# Patient Record
Sex: Male | Born: 1980 | Race: White | Hispanic: No | Marital: Single | Smoking: Current every day smoker
Health system: Southern US, Community
[De-identification: ages and names within clinical notes are randomized; demographics above are authoritative.]

## PROBLEM LIST (undated history)

## (undated) DIAGNOSIS — N200 Calculus of kidney: Secondary | ICD-10-CM

## (undated) DIAGNOSIS — G4730 Sleep apnea, unspecified: Secondary | ICD-10-CM

## (undated) DIAGNOSIS — F4310 Post-traumatic stress disorder, unspecified: Secondary | ICD-10-CM

## (undated) DIAGNOSIS — E119 Type 2 diabetes mellitus without complications: Secondary | ICD-10-CM

## (undated) DIAGNOSIS — I10 Essential (primary) hypertension: Secondary | ICD-10-CM

## (undated) DIAGNOSIS — F319 Bipolar disorder, unspecified: Secondary | ICD-10-CM

## (undated) HISTORY — PX: FRACTURE SURGERY: SHX138

## (undated) HISTORY — PX: APPENDECTOMY: SHX54

## (undated) HISTORY — PX: OTHER SURGICAL HISTORY: SHX169

---

## 2011-04-29 ENCOUNTER — Emergency Department: Payer: Self-pay | Admitting: Internal Medicine

## 2019-01-02 ENCOUNTER — Other Ambulatory Visit: Payer: Self-pay

## 2019-01-02 ENCOUNTER — Ambulatory Visit
Admission: EM | Admit: 2019-01-02 | Discharge: 2019-01-02 | Disposition: A | Discharge status: Home / Self Care | Payer: Medicare Other | Attending: Family Medicine | Admitting: Family Medicine

## 2019-01-02 ENCOUNTER — Ambulatory Visit (INDEPENDENT_AMBULATORY_CARE_PROVIDER_SITE_OTHER): Payer: Medicare Other

## 2019-01-02 ENCOUNTER — Encounter: Payer: Self-pay | Admitting: Emergency Medicine

## 2019-01-02 DIAGNOSIS — M25531 Pain in right wrist: Secondary | ICD-10-CM | POA: Diagnosis not present

## 2019-01-02 DIAGNOSIS — W06XXXA Fall from bed, initial encounter: Secondary | ICD-10-CM

## 2019-01-02 DIAGNOSIS — S63501A Unspecified sprain of right wrist, initial encounter: Secondary | ICD-10-CM

## 2019-01-02 HISTORY — DX: Essential (primary) hypertension: I10

## 2019-01-02 HISTORY — DX: Type 2 diabetes mellitus without complications: E11.9

## 2019-01-02 HISTORY — DX: Bipolar disorder, unspecified: F31.9

## 2019-01-02 HISTORY — DX: Post-traumatic stress disorder, unspecified: F43.10

## 2019-01-02 NOTE — ED Provider Notes (Signed)
[facility]    CSN: [id number] Arrival date & time: 01/02/19  1423      History   Chief Complaint Chief Complaint  Patient presents with  . Wrist Pain    HPI Daniel Roth is a 38 y.o. male.   HPI  38 year old male presents with right wrist pain that is had for a week.  Dates that he fell out of bed during a nightmare landing on his right dominant outstretched hand.  He states that he is tried over-the-counter Tylenol meloxicam which he takes on a routine basis without relief.  He does tell me that he had been given oxycodone for the pain on the eighth.  He states that he continues to hurt him and certain motions cause him the most pain.  He indicates the pain being over the distal ulnar styloid area.       Past Medical History:  Diagnosis Date  . Bipolar affective disorder (HCC)   . Diabetes mellitus without complication (HCC)   . Hypertension   . PTSD (post-traumatic stress disorder)     There are no active problems to display for this patient.   Past Surgical History:  Procedure Laterality Date  . APPENDECTOMY    . FRACTURE SURGERY Right    ankle  . thumb surgery Left        Home Medications    Prior to Admission medications   Medication Sig Start Date End Date Taking? Authorizing Provider  ARIPiprazole (ABILIFY) 30 MG tablet Take 30 mg by mouth daily.   Yes [provider]  FLUoxetine (PROZAC) 40 MG capsule Take 80 mg by mouth daily.   Yes [provider]  montelukast (SINGULAIR) 10 MG tablet Take 10 mg by mouth at bedtime.   Yes [provider]  sitaGLIPtin (JANUVIA) 100 MG tablet Take 100 mg by mouth daily.   Yes [provider]  valsartan (DIOVAN) 80 MG tablet Take 80 mg by mouth daily.   Yes [provider]    Family History Family History  Problem Relation Age of Onset  . Heart disease Mother   . Diabetes Father   . Melanoma Father     Social History Social History   Tobacco Use  .  Smoking status: Current Every Day Smoker    Packs/day: 0.50    Years: 20.00    Pack years: 10.00    Types: Cigarettes  . Smokeless tobacco: Never Used  Substance Use Topics  . Alcohol use: Never    Frequency: Never  . Drug use: Never     Allergies   Codeine, Cymbalta [duloxetine hcl], and Tramadol   Review of Systems Review of Systems  Constitutional: Positive for activity change. Negative for appetite change, chills, diaphoresis, fatigue and fever.  Musculoskeletal: Positive for arthralgias.  All other systems reviewed and are negative.    Physical Exam Triage Vital Signs ED Triage Vitals  Enc Vitals Group     BP 01/02/19 1438 134/72     Pulse Rate 01/02/19 1438 85     Resp 01/02/19 1438 18     Temp 01/02/19 1438 98.3 F (36.8 C)     Temp Source 01/02/19 1438 Oral     SpO2 01/02/19 1438 96 %     Weight 01/02/19 1437 285 lb (129.3 kg)     Height 01/02/19 1437 5\' 9"  (1.753 m)     Head Circumference --      Peak Flow --      Pain Score  01/02/19 1437 8     Pain Loc --      Pain Edu? --      Excl. in [place]? --    No data found.  Updated Vital Signs BP 134/72 (BP Location: Left Arm)   Pulse 85   Temp 98.3 F (36.8 C) (Oral)   Resp 18   Ht 5\' 9"  (1.753 m)   Wt 285 lb (129.3 kg)   SpO2 96%   BMI 42.09 kg/m   Visual Acuity Right Eye Distance:   Left Eye Distance:   Bilateral Distance:    Right Eye Near:   Left Eye Near:    Bilateral Near:     Physical Exam Vitals signs and nursing note reviewed.  Constitutional:      General: He is not in acute distress.    Appearance: Normal appearance. He is not ill-appearing, toxic-appearing or diaphoretic.  HENT:     Head: Normocephalic and atraumatic.     Nose: Nose normal.     Mouth/Throat:     Mouth: Mucous membranes are moist.  Eyes:     Conjunctiva/sclera: Conjunctivae normal.     Pupils: Pupils are equal, round, and reactive to light.  Neck:     Musculoskeletal: Normal range of motion and neck supple.   Cardiovascular:     Rate and Rhythm: Normal rate and regular rhythm.     Heart sounds: Normal heart sounds.  Pulmonary:     Effort: Pulmonary effort is normal.     Breath sounds: Normal breath sounds.  Musculoskeletal:        General: Tenderness and signs of injury present. No swelling or deformity.     Comments: Examination of the right dominant hand shows no significant swelling ecchymosis or erythema.  There are no breaks in the skin.  Patient has full pronation and supination.  Extension accompanied by pain to 45 degrees and flexion to 40 degrees.  Tenderness is sharply localized over the distal ulnar styloid.  The carpal bones are nontender.  Hand is uninvolved.  He has normal neurovascular function distally.  Skin:    General: Skin is warm and dry.  Neurological:     General: No focal deficit present.     Mental Status: He is alert and oriented to person, place, and time.  Psychiatric:        Mood and Affect: Mood normal.        Behavior: Behavior normal.      UC Treatments / Results  Labs (all labs ordered are listed, but only abnormal results are displayed) Labs Reviewed - No data to display  EKG   Radiology Dg Wrist Complete Right  Result Date: 01/02/2019 CLINICAL DATA:  Pain following fall EXAM: RIGHT WRIST - COMPLETE 3+ VIEW COMPARISON:  None. FINDINGS: Frontal, oblique, lateral, and ulnar deviation scaphoid images were obtained. No fracture or dislocation. Joint spaces appear normal. No erosive change or intra-articular calcification. IMPRESSION: No fracture or dislocation.  No evident arthropathy. Electronically Signed   By: Bretta BangWilliam  Woodruff III M.D.   On: 01/02/2019 15:04    Procedures Procedures (including critical care time)  Medications Ordered in UC Medications - No data to display  Initial Impression / Assessment and Plan / UC Course  I have reviewed the triage vital signs and the nursing notes.  Pertinent labs & imaging results that were available  during my care of the patient were reviewed by me and considered in my medical decision making (see chart for details).  I reviewed the x-rays with the patient.  Is read as no fracture seen although a small lytic line was noticed over the ulnar styloid but in very good position.  Patient was provided with a wrist splint that he will use for activities.  He will continue to take the meloxicam and Tylenol at home.  Is not improving in several weeks he should follow-up with orthopedics.  Final Clinical Impressions(s) / UC Diagnoses   Final diagnoses:  Sprain of right wrist, initial encounter     Discharge Instructions     Use the wrist splint for activities.  If you are not improving in several weeks recommend following up with an orthopedic surgeon    ED Prescriptions    None     I have reviewed the PDMP during this encounter.  Patient had requested narcotic analgesics after reviewing the PDMP refused to give the patient any narcotics.  Evidently he had received oxycodone on the day of the injury.   Lorin Picket, PA-C 01/02/19 1543

## 2019-01-02 NOTE — ED Triage Notes (Signed)
Patient in today c/o right wrist pain x 1 week. Patient states he fell out of bed last weekend and has had pain in his wrist since then. Patient has tried OTC Tylenol, Meloxicam without relief.

## 2019-01-02 NOTE — Discharge Instructions (Signed)
Use the wrist splint for activities.  If you are not improving in several weeks recommend following up with an orthopedic surgeon

## 2019-08-28 ENCOUNTER — Other Ambulatory Visit: Payer: Self-pay

## 2019-08-28 ENCOUNTER — Emergency Department
Admission: EM | Admit: 2019-08-28 | Discharge: 2019-08-28 | Disposition: A | Discharge status: Home / Self Care | Payer: Medicare Other | Attending: Emergency Medicine | Admitting: Emergency Medicine

## 2019-08-28 ENCOUNTER — Emergency Department: Payer: Medicare Other

## 2019-08-28 ENCOUNTER — Encounter: Payer: Self-pay | Admitting: Emergency Medicine

## 2019-08-28 DIAGNOSIS — R112 Nausea with vomiting, unspecified: Secondary | ICD-10-CM | POA: Insufficient documentation

## 2019-08-28 DIAGNOSIS — F17210 Nicotine dependence, cigarettes, uncomplicated: Secondary | ICD-10-CM | POA: Insufficient documentation

## 2019-08-28 DIAGNOSIS — Z7984 Long term (current) use of oral hypoglycemic drugs: Secondary | ICD-10-CM | POA: Insufficient documentation

## 2019-08-28 DIAGNOSIS — Z79899 Other long term (current) drug therapy: Secondary | ICD-10-CM | POA: Insufficient documentation

## 2019-08-28 DIAGNOSIS — K760 Fatty (change of) liver, not elsewhere classified: Secondary | ICD-10-CM

## 2019-08-28 DIAGNOSIS — R1011 Right upper quadrant pain: Secondary | ICD-10-CM | POA: Insufficient documentation

## 2019-08-28 DIAGNOSIS — E119 Type 2 diabetes mellitus without complications: Secondary | ICD-10-CM | POA: Insufficient documentation

## 2019-08-28 DIAGNOSIS — K7460 Unspecified cirrhosis of liver: Secondary | ICD-10-CM

## 2019-08-28 HISTORY — DX: Sleep apnea, unspecified: G47.30

## 2019-08-28 LAB — COMPREHENSIVE METABOLIC PANEL
ALT: 58 U/L — ABNORMAL HIGH (ref 0–44)
AST: 48 U/L — ABNORMAL HIGH (ref 15–41)
Albumin: 4 g/dL (ref 3.5–5.0)
Alkaline Phosphatase: 47 U/L (ref 38–126)
Anion gap: 7 (ref 5–15)
BUN: 7 mg/dL (ref 6–20)
CO2: 29 mmol/L (ref 22–32)
Calcium: 9 mg/dL (ref 8.9–10.3)
Chloride: 102 mmol/L (ref 98–111)
Creatinine, Ser: 0.67 mg/dL (ref 0.61–1.24)
GFR calc Af Amer: >60 mL/min (ref >60)
GFR calc non Af Amer: >60 mL/min (ref >60)
Glucose, Bld: 192 mg/dL — ABNORMAL HIGH (ref 70–99)
Potassium: 3.6 mmol/L (ref 3.5–5.1)
Sodium: 138 mmol/L (ref 135–145)
Total Bilirubin: 0.8 mg/dL (ref 0.3–1.2)
Total Protein: 7.2 g/dL (ref 6.5–8.1)

## 2019-08-28 LAB — CBC
HCT: 41.9 % (ref 39.0–52.0)
Hemoglobin: 13.7 g/dL (ref 13.0–17.0)
MCH: 31.2 pg (ref 26.0–34.0)
MCHC: 32.7 g/dL (ref 30.0–36.0)
MCV: 95.4 fL (ref 80.0–100.0)
Platelets: 135 10*3/uL — ABNORMAL LOW (ref 150–400)
RBC: 4.39 MIL/uL (ref 4.22–5.81)
RDW: 13.1 % (ref 11.5–15.5)
WBC: 6.8 10*3/uL (ref 4.0–10.5)
nRBC: 0 % (ref 0.0–0.2)

## 2019-08-28 LAB — LIPASE, BLOOD: Lipase: 29 U/L (ref 11–51)

## 2019-08-28 MED ORDER — MORPHINE SULFATE (PF) 4 MG/ML IV SOLN
4.0000 mg | Freq: Once | INTRAVENOUS | Status: AC
  Administered 2019-08-28: 4 mg via INTRAVENOUS
  Filled 2019-08-28: qty 1

## 2019-08-28 MED ORDER — ALUM & MAG HYDROXIDE-SIMETH 200-200-20 MG/5ML PO SUSP
30.0000 mL | Freq: Once | ORAL | Status: AC
  Administered 2019-08-28: 30 mL via ORAL
  Filled 2019-08-28: qty 30

## 2019-08-28 MED ORDER — OXYCODONE HCL 5 MG PO TABS: 5.0000 mg | ORAL_TABLET | Freq: Three times a day (TID) | ORAL | 0 refills | Status: DC | PRN

## 2019-08-28 MED ORDER — IOHEXOL 9 MG/ML PO SOLN
1000.0000 mL | Freq: Once | ORAL | Status: AC | PRN
  Administered 2019-08-28: 1000 mL via ORAL

## 2019-08-28 MED ORDER — SODIUM CHLORIDE 0.9 % IV BOLUS
1000.0000 mL | Freq: Once | INTRAVENOUS | Status: AC
  Administered 2019-08-28: 1000 mL via INTRAVENOUS

## 2019-08-28 MED ORDER — IOHEXOL 300 MG/ML  SOLN
125.0000 mL | Freq: Once | INTRAMUSCULAR | Status: AC | PRN
  Administered 2019-08-28: 125 mL via INTRAVENOUS

## 2019-08-28 MED ORDER — ONDANSETRON HCL 4 MG/2ML IJ SOLN
4.0000 mg | Freq: Once | INTRAMUSCULAR | Status: AC
  Administered 2019-08-28: 4 mg via INTRAVENOUS
  Filled 2019-08-28: qty 2

## 2019-08-28 MED ORDER — LIDOCAINE VISCOUS HCL 2 % MT SOLN
15.0000 mL | Freq: Once | OROMUCOSAL | Status: AC
  Administered 2019-08-28: 15 mL via ORAL
  Filled 2019-08-28: qty 15

## 2019-08-28 MED ORDER — FAMOTIDINE IN NACL 20-0.9 MG/50ML-% IV SOLN
20.0000 mg | Freq: Once | INTRAVENOUS | Status: AC
  Administered 2019-08-28: 20 mg via INTRAVENOUS
  Filled 2019-08-28: qty 50

## 2019-08-28 NOTE — ED Triage Notes (Signed)
Pt here for RUQ pain that is worse after eating for 1 week.  No vomiting or diarrhea. Nausea after eating.  No fevers.  No urinary sx.  Unlabored. VSS.  tneder to RUQ palpation.

## 2019-08-28 NOTE — ED Provider Notes (Signed)
[facility] Provider Note   CSN: [id number] Arrival date & time: 08/28/19  1509     History Chief Complaint  Patient presents with  . Abdominal Pain    Daniel Roth is a 39 y.o. male history of diabetes, bipolar, hypertension, PTSD here presenting with right upper quadrant pain.  Patient has been having intermittent right upper quadrant pain for the last week or so.  Patient states that it is worse after eating. He has some associated nausea vomiting as well.  Patient states that the pain got progressively worse so he came here for further evaluation.  Denies any chest pain or shortness of breath.  Denies any recent travels or history of gallbladder problems.  He states that he has a family history of gallstones.  The history is provided by the patient.       Past Medical History:  Diagnosis Date  . Bipolar affective disorder ([place])   . Diabetes mellitus without complication ([place])   . Hypertension   . PTSD (post-traumatic stress disorder)   . Sleep apnea     There are no problems to display for this patient.   Past Surgical History:  Procedure Laterality Date  . APPENDECTOMY    . FRACTURE SURGERY Right    ankle  . thumb surgery Left        Family History  Problem Relation Age of Onset  . Heart disease Mother   . Diabetes Father   . Melanoma Father     Social History   Tobacco Use  . Smoking status: Current Every Day Smoker    Packs/day: 0.50    Years: 20.00    Pack years: 10.00    Types: Cigarettes  . Smokeless tobacco: Never Used  Substance Use Topics  . Alcohol use: Never  . Drug use: Never    Home Medications Prior to Admission medications   Medication Sig Start Date End Date Taking? Authorizing Provider  ARIPiprazole (ABILIFY) 30 MG tablet Take 30 mg by mouth daily.    [provider]  FLUoxetine (PROZAC) 40 MG capsule Take 80 mg by mouth daily.    [provider]  montelukast  (SINGULAIR) 10 MG tablet Take 10 mg by mouth at bedtime.    [provider]  sitaGLIPtin (JANUVIA) 100 MG tablet Take 100 mg by mouth daily.    [provider]  valsartan (DIOVAN) 80 MG tablet Take 80 mg by mouth daily.    [provider]    Allergies    Codeine, Cymbalta [duloxetine hcl], and Tramadol  Review of Systems   Review of Systems  Gastrointestinal: Positive for abdominal pain.  All other systems reviewed and are negative.   Physical Exam Updated Vital Signs BP 124/77   Pulse 74   Temp 98 F (36.7 C) (Oral)   Resp 18   Ht 5\' 9"  (1.753 m)   Wt (!) 137.9 kg   SpO2 100%   BMI 44.89 kg/m   Physical Exam Vitals and nursing note reviewed.  Constitutional:      Comments: Uncomfortable   HENT:     Head: Normocephalic.     Mouth/Throat:     Mouth: Mucous membranes are moist.  Eyes:     Extraocular Movements: Extraocular movements intact.  Cardiovascular:     Rate and Rhythm: Normal rate and regular rhythm.  Abdominal:     Comments: + RUQ tenderness, ? Murphy's   Skin:    General: Skin is warm.  Capillary Refill: Capillary refill takes less than 2 seconds.  Neurological:     General: No focal deficit present.     Mental Status: He is alert and oriented to person, place, and time.  Psychiatric:        Mood and Affect: Mood normal.        Behavior: Behavior normal.     ED Results / Procedures / Treatments   Labs (all labs ordered are listed, but only abnormal results are displayed) Labs Reviewed  COMPREHENSIVE METABOLIC PANEL - Abnormal; Notable for the following components:      Result Value   Glucose, Bld 192 (*)    AST 48 (*)    ALT 58 (*)    All other components within normal limits  CBC - Abnormal; Notable for the following components:   Platelets 135 (*)    All other components within normal limits  LIPASE, BLOOD  URINALYSIS, COMPLETE (UACMP) WITH MICROSCOPIC  HEPATITIS PANEL, ACUTE    EKG None  Radiology US  Abdomen Limited RUQ  Result Date: 08/28/2019 CLINICAL DATA:  Right upper quadrant pain EXAM: ULTRASOUND ABDOMEN LIMITED RIGHT UPPER QUADRANT COMPARISON:  CT 05/11/2018 FINDINGS: Gallbladder: No visible shadowing gallstones. Much of the gallbladder wall is normal thickness however along the anterior gallbladder body is a region of focal mural thickening measuring up to 7 mm. No echogenic foci, shadowing or, tail artifact is seen. No pericholecystic fluid. Sonographic Eulah Pont sign is reportedly negative. Common bile duct: Diameter: 4.9 mm, nondilated Liver: Nodular hepatic surface contour. Diffusely increased liver echogenicity. No focal lesion. Portal vein is patent on color Doppler imaging with normal direction of blood flow towards the liver. Other: None. IMPRESSION: Nodular hepatic surface contour may be seen in the setting of cirrhosis or intrinsic liver disease. Correlate with history and liver serologies. Diffusely increased liver echogenicity, often seen with fatty infiltration. Nonspecific focal gallbladder wall thickening along the mid body without other clear sonographic features for distinction. Broad differential with primary considerations including edematous thickening in the setting of intrinsic liver disease or focus of adenomyomatosis/cholesterolosis. Inflammation is considered less likely in the absence of other convincing features of cholecystitis. Consider repeat imaging in 3-6 months to assess for stability versus palpation contrast-enhanced MRI. Electronically Signed   By: Kreg Shropshire M.D.   On: 08/28/2019 16:44    Procedures Procedures (including critical care time)  Medications Ordered in ED Medications  famotidine (PEPCID) IVPB 20 mg premix (20 mg Intravenous New Bag/Given 08/28/19 1749)  sodium chloride 0.9 % bolus 1,000 mL (1,000 mLs Intravenous New Bag/Given 08/28/19 1749)  alum & mag hydroxide-simeth (MAALOX/MYLANTA) 200-200-20 MG/5ML suspension 30 mL (30 mLs Oral Given 08/28/19  1749)    And  lidocaine (XYLOCAINE) 2 % viscous mouth solution 15 mL (15 mLs Oral Given 08/28/19 1749)  morphine 4 MG/ML injection 4 mg (4 mg Intravenous Given 08/28/19 1749)  ondansetron (ZOFRAN) injection 4 mg (4 mg Intravenous Given 08/28/19 1749)  iohexol (OMNIPAQUE) 9 MG/ML oral solution 1,000 mL (1,000 mLs Oral Contrast Given 08/28/19 1734)    ED Course  I have reviewed the triage vital signs and the nursing notes.  Pertinent labs & imaging results that were available during my care of the patient were reviewed by me and considered in my medical decision making (see chart for details).    MDM Rules/Calculators/A&P                      Daniel Roth is a 40 y.o.  male who presented with right upper quadrant pain.  Consider biliary colic versus hepatitis versus fatty liver.  Will get CBC, CMP, lipase, right upper quadrant ultrasound.  If right upper quadrant ultrasound is unremarkable, may need CT abdomen pelvis to further assess.   6:56 PM  Right upper quadrant ultrasound showed some fatty liver and nonspecific gallbladder wall thickening but no gallstones.  His LFTs are mildly elevated so CT abdomen pelvis was performed and there is some fatty liver and some cirrhosis.  Patient does take a lot of Tylenol.  Patient denies overdose on Tylenol.  Patient states that he does not drink alcohol.  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  Told him that he needs to not take Tylenol.  He states that he is on meloxicam so cannot take ibuprofen.  Will give several pills of oxycodone for pain.  Will refer to GI for follow-up.  Final Clinical Impression(s) / ED Diagnoses Final diagnoses:  Right upper quadrant abdominal pain    Rx / DC Orders ED Discharge Orders    None       Charlynne Pander, MD 08/28/19 1858

## 2019-08-28 NOTE — Discharge Instructions (Addendum)
You have fatty liver likely from your Tylenol use.   Please avoid taking Tylenol.  Please try and lose weight.  Take oxycodone for severe pain.  You need to see a GI doctor for further evaluation.  You have some cirrhosis changes to your liver as well.  Return to ER if you have worsening abdominal pain, vomiting, fevers.

## 2019-09-27 ENCOUNTER — Encounter: Payer: Self-pay | Admitting: Emergency Medicine

## 2019-09-27 ENCOUNTER — Emergency Department
Admission: EM | Admit: 2019-09-27 | Discharge: 2019-09-27 | Disposition: A | Discharge status: Home / Self Care | Payer: Medicare Other | Attending: Emergency Medicine | Admitting: Emergency Medicine

## 2019-09-27 ENCOUNTER — Other Ambulatory Visit: Payer: Self-pay

## 2019-09-27 ENCOUNTER — Emergency Department: Payer: Medicare Other

## 2019-09-27 DIAGNOSIS — Z7984 Long term (current) use of oral hypoglycemic drugs: Secondary | ICD-10-CM | POA: Insufficient documentation

## 2019-09-27 DIAGNOSIS — I10 Essential (primary) hypertension: Secondary | ICD-10-CM | POA: Diagnosis not present

## 2019-09-27 DIAGNOSIS — F17210 Nicotine dependence, cigarettes, uncomplicated: Secondary | ICD-10-CM | POA: Diagnosis not present

## 2019-09-27 DIAGNOSIS — E119 Type 2 diabetes mellitus without complications: Secondary | ICD-10-CM | POA: Insufficient documentation

## 2019-09-27 DIAGNOSIS — Z79899 Other long term (current) drug therapy: Secondary | ICD-10-CM | POA: Diagnosis not present

## 2019-09-27 DIAGNOSIS — M25561 Pain in right knee: Secondary | ICD-10-CM | POA: Insufficient documentation

## 2019-09-27 MED ORDER — OXYCODONE-ACETAMINOPHEN 5-325 MG PO TABS: 1.0000 | ORAL_TABLET | Freq: Four times a day (QID) | ORAL | 0 refills | Status: AC | PRN

## 2019-09-27 NOTE — Discharge Instructions (Signed)
Call make an appointment with Dr. Rosita Kea who is on-call for orthopedics.  His contact information is listed on your discharge papers.  Ice and elevate your knee as needed for discomfort and if there is any swelling.  Wear the knee immobilizer anytime you are up walking.  Continue your anti-inflammatory at home as directed with food.  A prescription for pain medication was sent to your pharmacy to take as needed.

## 2019-09-27 NOTE — ED Provider Notes (Signed)
[facility] Emergency Department Provider Note  ____________________________________________   First MD Initiated Contact with Patient 09/27/19 1323     (approximate)  I have reviewed the triage vital signs and the nursing notes.   HISTORY  Chief Complaint Knee Pain   HPI Daniel Roth is a 39 y.o. male presents to the ED with complaint of right knee pain.  Patient states that it began last evening and feels swollen and sore.  Patient denies any recent injury but has had problems with his knee in the past.  Patient currently is taking an anti-inflammatory twice a day.  He states it is painful to bear weight.  He rates his pain as 9 out of 10.      Past Medical History:  Diagnosis Date  . Bipolar affective disorder (HCC)   . Diabetes mellitus without complication (HCC)   . Hypertension   . PTSD (post-traumatic stress disorder)   . Sleep apnea     There are no problems to display for this patient.   Past Surgical History:  Procedure Laterality Date  . APPENDECTOMY    . FRACTURE SURGERY Right    ankle  . thumb surgery Left     Prior to Admission medications   Medication Sig Start Date End Date Taking? Authorizing Provider  ARIPiprazole (ABILIFY) 30 MG tablet Take 30 mg by mouth daily.    [provider]  FLUoxetine (PROZAC) 40 MG capsule Take 80 mg by mouth daily.    [provider]  montelukast (SINGULAIR) 10 MG tablet Take 10 mg by mouth at bedtime.    [provider]  oxyCODONE-acetaminophen (PERCOCET) 5-325 MG tablet Take 1 tablet by mouth every 6 (six) hours as needed for severe pain. 09/27/19 09/26/20  Tommi Rumps, PA-C  sitaGLIPtin (JANUVIA) 100 MG tablet Take 100 mg by mouth daily.    [provider]  valsartan (DIOVAN) 80 MG tablet Take 80 mg by mouth daily.    [provider]    Allergies Codeine, Cymbalta [duloxetine hcl], and Tramadol  Family History  Problem Relation Age of Onset   . Heart disease Mother   . Diabetes Father   . Melanoma Father     Social History Social History   Tobacco Use  . Smoking status: Current Every Day Smoker    Packs/day: 0.50    Years: 20.00    Pack years: 10.00    Types: Cigarettes  . Smokeless tobacco: Never Used  Vaping Use  . Vaping Use: Never used  Substance Use Topics  . Alcohol use: Never  . Drug use: Never    Review of Systems Constitutional: No fever/chills Eyes: No visual changes. Cardiovascular: Denies chest pain. Respiratory: Denies shortness of breath. Gastrointestinal: No abdominal pain.  No nausea, no vomiting.   Musculoskeletal: Positive for right knee pain. Skin: Negative for rash. Neurological: Negative for headaches, focal weakness or numbness. ____________________________________________   PHYSICAL EXAM:  VITAL SIGNS: ED Triage Vitals  Enc Vitals Group     BP 09/27/19 1315 (!) 169/81     Pulse Rate 09/27/19 1315 66     Resp 09/27/19 1315 18     Temp 09/27/19 1315 98.3 F (36.8 C)     Temp Source 09/27/19 1315 Oral     SpO2 09/27/19 1315 96 %     Weight 09/27/19 1256 (!) 302 lb 0.5 oz (137 kg)     Height 09/27/19 1256 5\' 9"  (1.753 m)     Head Circumference --  Peak Flow --      Pain Score 09/27/19 1336 9     Pain Loc --      Pain Edu? --      Excl. in [place]? --    Constitutional: Alert and oriented. Well appearing and in no acute distress. Eyes: Conjunctivae are normal.  Head: Atraumatic. Neck: No stridor.   Cardiovascular: Normal rate, regular rhythm. Grossly normal heart sounds.  Good peripheral circulation. Respiratory: Normal respiratory effort.  No retractions. Lungs CTAB. Musculoskeletal: On examination of the right knee there is no gross deformity and no effusion is appreciated with palpation.  Patient is tender to his patella on palpation both superior and inferiorly questionably on the patella tendon.  No erythema or warmth is noted.  Skin is intact.  Range of motion is slow  and restricted secondary to pain.  No crepitus was appreciated. Neurologic:  Normal speech and language. No gross focal neurologic deficits are appreciated.  Skin:  Skin is warm, dry and intact. No rash noted. Psychiatric: Mood and affect are normal. Speech and behavior are normal.  ____________________________________________   LABS (all labs ordered are listed, but only abnormal results are displayed)  Labs Reviewed - No data to display  RADIOLOGY   Official radiology report(s): DG Knee Complete 4 Views Right  Result Date: 09/27/2019 CLINICAL DATA:  Right knee swelling, pain EXAM: RIGHT KNEE - COMPLETE 4+ VIEW COMPARISON:  None. FINDINGS: No evidence of fracture, dislocation, or joint effusion. No evidence of arthropathy or other focal bone abnormality. Soft tissues are unremarkable. IMPRESSION: No acute osseous finding Electronically Signed   By: Jerilynn Mages.  Shick M.D.   On: 09/27/2019 14:15    ____________________________________________   PROCEDURES  Procedure(s) performed (including Critical Care):  Procedures Knee immobilizer was applied by nursing staff.  ____________________________________________   INITIAL IMPRESSION / ASSESSMENT AND PLAN / ED COURSE  As part of my medical decision making, I reviewed the following data within the electronic MEDICAL RECORD NUMBER Notes from prior ED visits and [place] Controlled Substance Database  39 year old male presents to the ED with complaint of right knee pain that began last evening without any history of recent injury.  Patient states he has had problems with his knee in the past.  On exam he is moderately tender generalized anterior patella without effusion.  Range of motion is slow and guarded.  No crepitus is noted with range of motion.  X-rays were negative and reassuring.  Patient was made aware.  A knee immobilizer was applied and patient is aware that if he continues to have problems he needs to follow-up with an orthopedist.  Currently  he is taking an anti-inflammatory at home.  A prescription for pain medication was sent to his pharmacy.  He is encouraged to ice and elevate as needed for discomfort.  ____________________________________________   FINAL CLINICAL IMPRESSION(S) / ED DIAGNOSES  Final diagnoses:  Acute pain of right knee     ED Discharge Orders         Ordered    oxyCODONE-acetaminophen (PERCOCET) 5-325 MG tablet  Every 6 hours PRN     Discontinue  Reprint     09/27/19 1429           Note:  This document was prepared using Dragon voice recognition software and may include unintentional dictation errors.    Johnn Hai, PA-C 09/27/19 1449    Lavonia Drafts, MD 09/27/19 1454

## 2019-09-27 NOTE — ED Triage Notes (Signed)
Presents with right knee pain  States knee is swollen and sore no injury

## 2019-10-19 ENCOUNTER — Emergency Department: Payer: Medicare Other

## 2019-10-19 ENCOUNTER — Encounter: Payer: Self-pay | Admitting: Emergency Medicine

## 2019-10-19 ENCOUNTER — Emergency Department
Admission: EM | Admit: 2019-10-19 | Discharge: 2019-10-19 | Disposition: A | Discharge status: Home / Self Care | Payer: Medicare Other | Attending: Emergency Medicine | Admitting: Emergency Medicine

## 2019-10-19 ENCOUNTER — Other Ambulatory Visit: Payer: Self-pay

## 2019-10-19 DIAGNOSIS — N410 Acute prostatitis: Secondary | ICD-10-CM

## 2019-10-19 DIAGNOSIS — N200 Calculus of kidney: Secondary | ICD-10-CM | POA: Diagnosis not present

## 2019-10-19 DIAGNOSIS — E119 Type 2 diabetes mellitus without complications: Secondary | ICD-10-CM | POA: Insufficient documentation

## 2019-10-19 DIAGNOSIS — I10 Essential (primary) hypertension: Secondary | ICD-10-CM | POA: Insufficient documentation

## 2019-10-19 DIAGNOSIS — F17210 Nicotine dependence, cigarettes, uncomplicated: Secondary | ICD-10-CM | POA: Insufficient documentation

## 2019-10-19 DIAGNOSIS — R309 Painful micturition, unspecified: Secondary | ICD-10-CM | POA: Diagnosis present

## 2019-10-19 HISTORY — DX: Calculus of kidney: N20.0

## 2019-10-19 LAB — URINALYSIS, COMPLETE (UACMP) WITH MICROSCOPIC
Bilirubin Urine: NEGATIVE
Glucose, UA: >=500 mg/dL — AB
Hgb urine dipstick: NEGATIVE
Ketones, ur: NEGATIVE mg/dL
Nitrite: NEGATIVE
Protein, ur: 30 mg/dL — AB
Specific Gravity, Urine: 1.017 (ref 1.005–1.030)
pH: 6 (ref 5.0–8.0)

## 2019-10-19 LAB — CBC
HCT: 42.3 % (ref 39.0–52.0)
Hemoglobin: 14.4 g/dL (ref 13.0–17.0)
MCH: 30.4 pg (ref 26.0–34.0)
MCHC: 34 g/dL (ref 30.0–36.0)
MCV: 89.2 fL (ref 80.0–100.0)
Platelets: 153 10*3/uL (ref 150–400)
RBC: 4.74 MIL/uL (ref 4.22–5.81)
RDW: 13.4 % (ref 11.5–15.5)
WBC: 6.6 10*3/uL (ref 4.0–10.5)
nRBC: 0 % (ref 0.0–0.2)

## 2019-10-19 LAB — BASIC METABOLIC PANEL
Anion gap: 9 (ref 5–15)
BUN: 11 mg/dL (ref 6–20)
CO2: 27 mmol/L (ref 22–32)
Calcium: 8.9 mg/dL (ref 8.9–10.3)
Chloride: 101 mmol/L (ref 98–111)
Creatinine, Ser: 0.77 mg/dL (ref 0.61–1.24)
GFR calc Af Amer: >60 mL/min (ref >60)
GFR calc non Af Amer: >60 mL/min (ref >60)
Glucose, Bld: 213 mg/dL — ABNORMAL HIGH (ref 70–99)
Potassium: 3.5 mmol/L (ref 3.5–5.1)
Sodium: 137 mmol/L (ref 135–145)

## 2019-10-19 MED ORDER — OXYCODONE-ACETAMINOPHEN 5-325 MG PO TABS
1.0000 | ORAL_TABLET | ORAL | Status: DC | PRN
  Administered 2019-10-19: 1 via ORAL
  Filled 2019-10-19: qty 1

## 2019-10-19 MED ORDER — ONDANSETRON 4 MG PO TBDP: 4.0000 mg | ORAL_TABLET | Freq: Three times a day (TID) | ORAL | 0 refills | Status: AC | PRN

## 2019-10-19 MED ORDER — HYDROCODONE-ACETAMINOPHEN 5-325 MG PO TABS: 1.0000 | ORAL_TABLET | Freq: Four times a day (QID) | ORAL | 0 refills | Status: AC | PRN

## 2019-10-19 MED ORDER — SULFAMETHOXAZOLE-TRIMETHOPRIM 800-160 MG PO TABS
1.0000 | ORAL_TABLET | Freq: Two times a day (BID) | ORAL | 0 refills | Status: AC
Start: 2019-10-19 — End: ?

## 2019-10-19 NOTE — Discharge Instructions (Signed)
Please follow up with urology if not improving over the next few days.  Return to the ER for symptoms that change or worsen if unable to schedule an appointment.

## 2019-10-19 NOTE — ED Triage Notes (Signed)
Here for right flank pain starting today. Hx kidney stones and feels same.  Slight pink tint to urine per pt. Unlabored. VSS. NAD at this time.

## 2019-10-19 NOTE — ED Provider Notes (Signed)
[facility] Emergency Department Provider Note ____________________________________________   First MD Initiated Contact with Patient 10/19/19 1550     (approximate)  I have reviewed the triage vital signs and the nursing notes.   HISTORY  Chief Complaint Flank Pain  HPI Daniel Roth is a 39 y.o. male presents to the emergency department for treatment and evaluation of right flank pain.  Pain started last night.  Similar symptoms with previous kidney stones.  He also states that he has burning with urination that started last night as well.  He has noticed some pink tinge to his urine.  No relief with Flomax.         Past Medical History:  Diagnosis Date  . Bipolar affective disorder (HCC)   . Diabetes mellitus without complication (HCC)   . Hypertension   . Kidney stone   . PTSD (post-traumatic stress disorder)   . Sleep apnea     There are no problems to display for this patient.   Past Surgical History:  Procedure Laterality Date  . APPENDECTOMY    . FRACTURE SURGERY Right    ankle  . thumb surgery Left     Prior to Admission medications   Medication Sig Start Date End Date Taking? Authorizing Provider  ARIPiprazole (ABILIFY) 30 MG tablet Take 30 mg by mouth daily.    [provider]  FLUoxetine (PROZAC) 40 MG capsule Take 80 mg by mouth daily.    [provider]  HYDROcodone-acetaminophen (NORCO/VICODIN) 5-325 MG tablet Take 1 tablet by mouth every 6 (six) hours as needed for up to 3 days for severe pain. 10/19/19 10/22/19  Maelys Kinnick, Rulon Eisenmengerari B, FNP  montelukast (SINGULAIR) 10 MG tablet Take 10 mg by mouth at bedtime.    [provider]  ondansetron (ZOFRAN-ODT) 4 MG disintegrating tablet Take 1 tablet (4 mg total) by mouth every 8 (eight) hours as needed for nausea or vomiting. 10/19/19   Shann Merrick, Rulon Eisenmengerari B, FNP  oxyCODONE-acetaminophen (PERCOCET) 5-325 MG tablet Take 1 tablet by mouth every 6 (six) hours as needed for  severe pain. 09/27/19 09/26/20  Tommi RumpsSummers, Rhonda L, PA-C  sitaGLIPtin (JANUVIA) 100 MG tablet Take 100 mg by mouth daily.    [provider]  sulfamethoxazole-trimethoprim (BACTRIM DS) 800-160 MG tablet Take 1 tablet by mouth 2 (two) times daily. 10/19/19   Angeldejesus Callaham B, FNP  valsartan (DIOVAN) 80 MG tablet Take 80 mg by mouth daily.    [provider]    Allergies Codeine, Cymbalta [duloxetine hcl], and Tramadol  Family History  Problem Relation Age of Onset  . Heart disease Mother   . Diabetes Father   . Melanoma Father     Social History Social History   Tobacco Use  . Smoking status: Current Every Day Smoker    Packs/day: 0.50    Years: 20.00    Pack years: 10.00    Types: Cigarettes  . Smokeless tobacco: Never Used  Vaping Use  . Vaping Use: Never used  Substance Use Topics  . Alcohol use: Never  . Drug use: Never    Review of Systems  Constitutional: No fever/chills Eyes: No visual changes. ENT: No sore throat. Cardiovascular: Denies chest pain. Respiratory: Denies shortness of breath. Gastrointestinal: No abdominal pain.  No nausea, no vomiting.  No diarrhea.  No constipation. Genitourinary: Positive for dysuria. Musculoskeletal: Positive for back pain. Skin: Negative for rash. Neurological: Negative for headaches, focal weakness or numbness. ____________________________________________   PHYSICAL EXAM:  VITAL SIGNS:  ED Triage Vitals  Enc Vitals Group     BP 10/19/19 1359 (!) 149/87     Pulse Rate 10/19/19 1359 85     Resp 10/19/19 1359 18     Temp 10/19/19 1359 97.9 F (36.6 C)     Temp Source 10/19/19 1359 Oral     SpO2 10/19/19 1359 98 %     Weight 10/19/19 1356 299 lb (135.6 kg)     Height 10/19/19 1356 5\' 9"  (1.753 m)     Head Circumference --      Peak Flow --      Pain Score 10/19/19 1356 9     Pain Loc --      Pain Edu? --      Excl. in GC? --     Constitutional: Alert and oriented. Well appearing and in no acute  distress. Eyes: Conjunctivae are normal.  Head: Atraumatic. Nose: No congestion/rhinnorhea. Mouth/Throat: Mucous membranes are moist.  Oropharynx non-erythematous. Neck: No stridor.   Hematological/Lymphatic/Immunilogical: No cervical lymphadenopathy. Cardiovascular: Normal rate, regular rhythm. Grossly normal heart sounds.  Good peripheral circulation. Respiratory: Normal respiratory effort.  No retractions. Lungs CTAB. Gastrointestinal: Soft and nontender. No distention. No abdominal bruits. Right side CVA tenderness. Genitourinary:  Musculoskeletal: No lower extremity tenderness nor edema.  No joint effusions. Neurologic:  Normal speech and language. No gross focal neurologic deficits are appreciated. No gait instability. Skin:  Skin is warm, dry and intact. No rash noted. Psychiatric: Mood and affect are normal. Speech and behavior are normal.  ____________________________________________   LABS (all labs ordered are listed, but only abnormal results are displayed)  Labs Reviewed  URINALYSIS, COMPLETE (UACMP) WITH MICROSCOPIC - Abnormal; Notable for the following components:      Result Value   Color, Urine YELLOW (*)    APPearance HAZY (*)    Glucose, UA >=500 (*)    Protein, ur 30 (*)    Leukocytes,Ua TRACE (*)    Bacteria, UA RARE (*)    All other components within normal limits  BASIC METABOLIC PANEL - Abnormal; Notable for the following components:   Glucose, Bld 213 (*)    All other components within normal limits  CBC   ____________________________________________  EKG  Not indicated. ____________________________________________  RADIOLOGY  ED MD interpretation:    CT renal stone study shows nonobstructing intrarenal stones on the right side.  Also incidental finding of inflammatory changes in the region of the prostate a consistent with prostatitis.  I, 12/20/19, personally viewed and evaluated these images (plain radiographs) as part of my medical  decision making, as well as reviewing the written report by the radiologist.  Official radiology report(s): CT Renal Stone Study  Result Date: 10/19/2019 CLINICAL DATA:  Flank pain. Kidney stones suspected. RIGHT flank pain since last night. Painful with urination. Taking Flomax. EXAM: CT ABDOMEN AND PELVIS WITHOUT CONTRAST TECHNIQUE: Multidetector CT imaging of the abdomen and pelvis was performed following the standard protocol without IV contrast. COMPARISON:  CT of the abdomen and pelvis on 08/28/2019 FINDINGS: Lower chest: 4 millimeter nodule is identified in the RIGHT middle lobe and is incompletely characterized. No focal consolidations or pleural effusions. The heart is normal in appearance. Hepatobiliary: The liver is diffusely low in attenuation. The contour of the liver is nodular and the caudate lobe is hypertrophied. Findings are consistent with cirrhosis. Gallbladder is present and normal in CT appearance. Pancreas: Unremarkable. No pancreatic ductal dilatation or surrounding inflammatory changes. Spleen: Splenomegaly.  No focal lesion. Adrenals/Urinary  Tract: The adrenal glands are normal in appearance. Kidneys are symmetric in size. A single 1 millimeter calcification is identified in the midpole region of the RIGHT kidney. No intrarenal calculi on the LEFT. The ureters are unremarkable. The bladder and visualized portion of the urethra are normal. Stomach/Bowel: Stomach and small bowel loops are normal in appearance. Previous appendectomy. There is fat deposition within the wall of the colon, which can be seen in the setting of inflammatory bowel disease but can also be a normal finding. No inflammatory changes within the mesocolon. There are scattered diverticula within the descending and sigmoid colon, not associated with focal inflammatory changes. Vascular/Lymphatic: There is atherosclerotic calcification of the abdominal aorta, not associated with aneurysm. No retroperitoneal or mesenteric  adenopathy. Reproductive: Prostate gland is normal in size. There is mild inflammatory stranding surrounding the prostate and seminal vesicles, raising the question of prostatitis. No fluid collection or mass. Other: No ascites.  Fat within the inguinal regions bilaterally. Musculoskeletal: No acute or significant osseous findings. IMPRESSION: 1. Hepatic steatosis and cirrhosis.  Splenomegaly. 2. Nonobstructing RIGHT intrarenal calculus. 3. Fat deposition within the wall of the colon, which can be associated with inflammatory bowel disease but can also be a normal finding. Recommend correlation with GI symptoms. 4. Inflammatory changes in the region of prostate raising the question of prostatitis. 5. Colonic diverticulosis without acute diverticulitis. 6. Fat within the inguinal regions bilaterally. 7. 4 millimeter nodule in the RIGHT middle lobe is incompletely characterized. No follow-up needed if patient is low-risk. Non-contrast chest CT can be considered in 12 months if patient is high-risk. This recommendation follows the consensus statement: Guidelines for Management of Incidental Pulmonary Nodules Detected on CT Images: From the Fleischner Society 2017; Radiology 2017; 284:228-243. 8. Aortic Atherosclerosis (ICD10-I70.0). Electronically Signed   By: Norva Pavlov M.D.   On: 10/19/2019 17:14    ____________________________________________   PROCEDURES  Procedure(s) performed (including Critical Care):  Procedures  ____________________________________________   INITIAL IMPRESSION / ASSESSMENT AND PLAN     39 year old male presenting to the emergency department for treatment and evaluation of right flank pain.  See HPI for further details.  Symptoms and exam concerning for kidney stone.  Will order CT renal stone study.  Patient was given pain medication while awaiting ER room assignment.  He states that the pain is controlled at this time.  DIFFERENTIAL DIAGNOSIS  Acute cystitis,  kidney stone always forget  ED COURSE  Urinalysis concerning for acute prostatitis as is the CT result.  Kidney stone is not likely to be causing pain as it is nonobstructing and remained intrarenal.  He will be placed on Bactrim and given pain medication.  He will also be given Zofran as he has a history of nausea with codeine-based medications.  He was advised to follow-up with urology if not improving over the week. We also discussed return precautions. ____________________________________________   FINAL CLINICAL IMPRESSION(S) / ED DIAGNOSES  Final diagnoses:  Prostatitis, acute  Kidney stone on right side     ED Discharge Orders         Ordered    sulfamethoxazole-trimethoprim (BACTRIM DS) 800-160 MG tablet  2 times daily     Discontinue  Reprint     10/19/19 1736    HYDROcodone-acetaminophen (NORCO/VICODIN) 5-325 MG tablet  Every 6 hours PRN     Discontinue  Reprint     10/19/19 1736    ondansetron (ZOFRAN-ODT) 4 MG disintegrating tablet  Every 8 hours PRN  Discontinue  Reprint     10/19/19 1736           Garey Worrell was evaluated in Emergency Department on 10/19/2019 for the symptoms described in the history of present illness. He was evaluated in the context of the global COVID-19 pandemic, which necessitated consideration that the patient might be at risk for infection with the SARS-CoV-2 virus that causes COVID-19. Institutional protocols and algorithms that pertain to the evaluation of patients at risk for COVID-19 are in a state of rapid change based on information released by regulatory bodies including the CDC and federal and state organizations. These policies and algorithms were followed during the patient's care in the ED.   Note:  This document was prepared using Dragon voice recognition software and may include unintentional dictation errors.   Chinita Pester, FNP 10/19/19 2123    Sharyn Creamer, MD 10/19/19 [id number]

## 2019-10-19 NOTE — ED Notes (Signed)
See triage note, pt reports "I think I have another kidney stone". Hx of kidney stones. Reports pain on right flank that started last night.  Reports has rx for flomax and has been taking it, pain with urination.  Denies N/V.

## 2019-10-19 NOTE — ED Notes (Signed)
Aunt drove. Pt aware cannot drive.

## 2019-10-19 NOTE — ED Triage Notes (Signed)
First nurse note- here for possible kidney stone. Hx of same, NAD

## 2022-02-15 IMAGING — US US ABDOMEN LIMITED
1 series · 13 of 25 positions shown · non-contrast
Comparison: CT 05/11/2018

CLINICAL DATA: Right upper quadrant pain

EXAM:
ULTRASOUND ABDOMEN LIMITED RIGHT UPPER QUADRANT

[Series 1: us abdomen limited ruq · 13 of 47 slices shown]
[im 1/47]
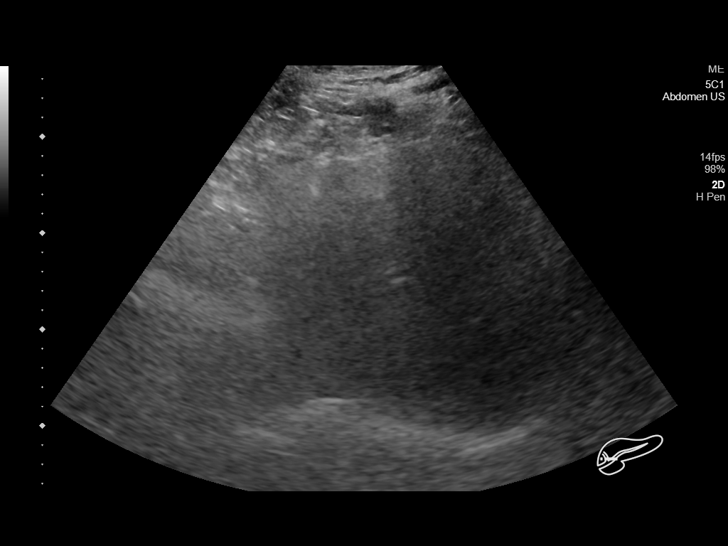
[im 4/47]
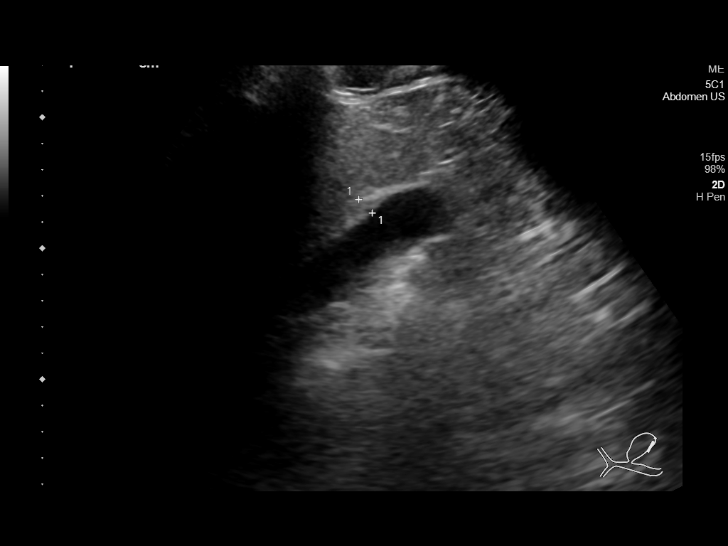
[im 8/47]
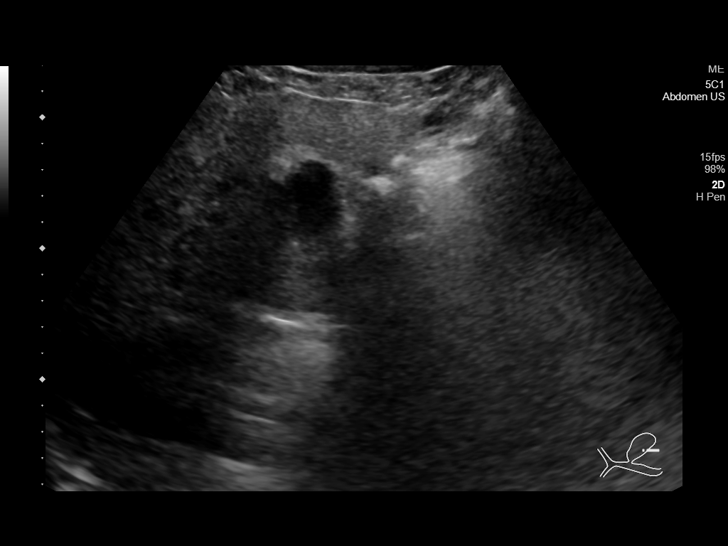
[im 12/47]
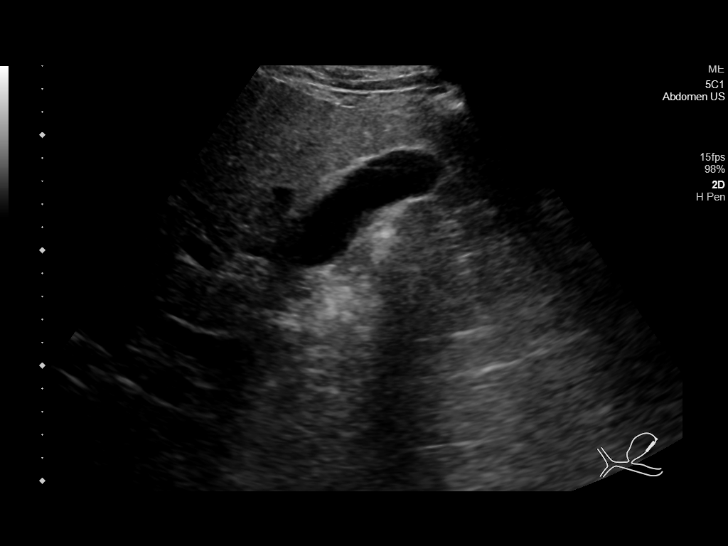
[im 16/47]
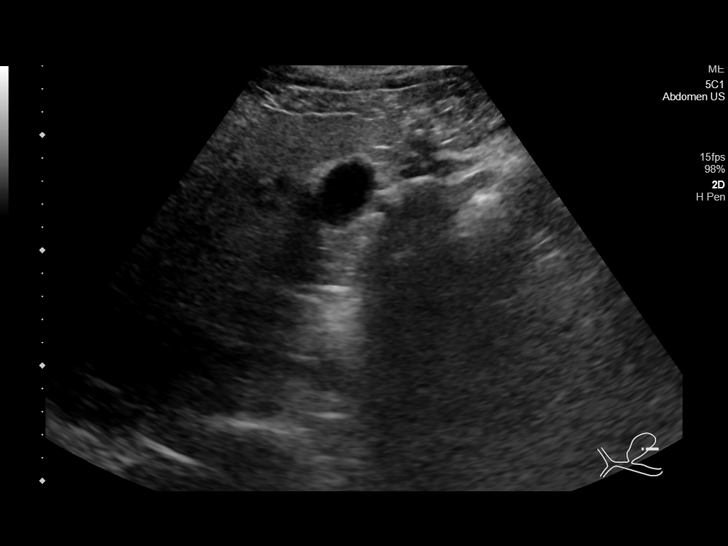
[im 20/47]
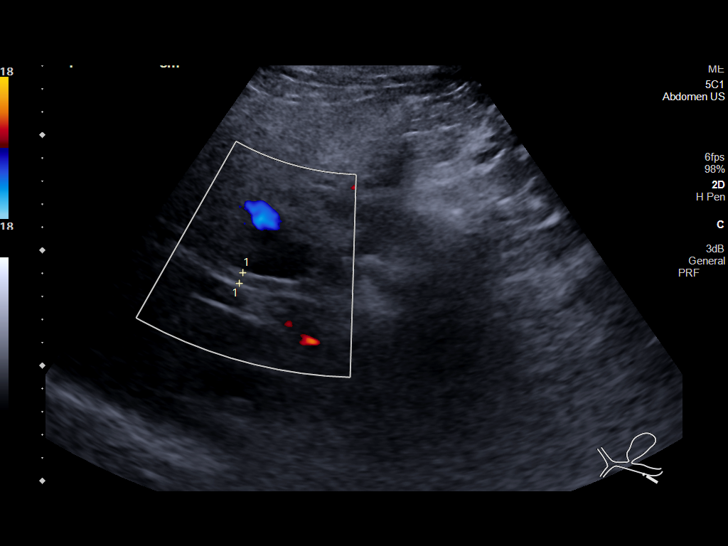
[im 24/47]
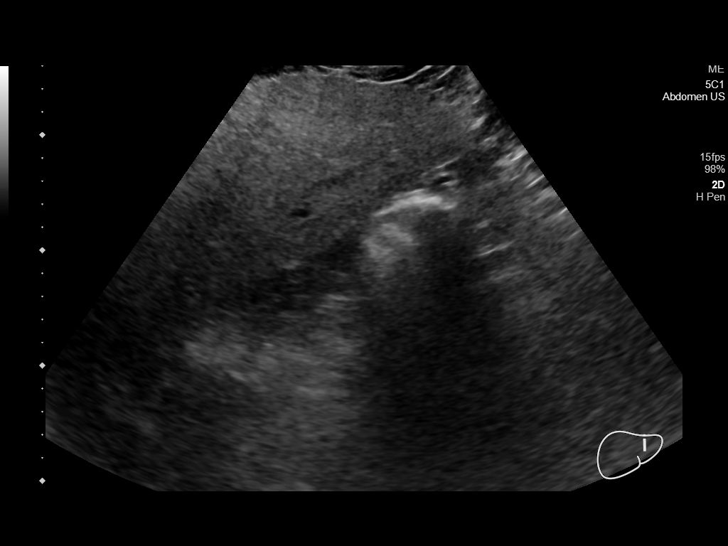
[im 27/47]
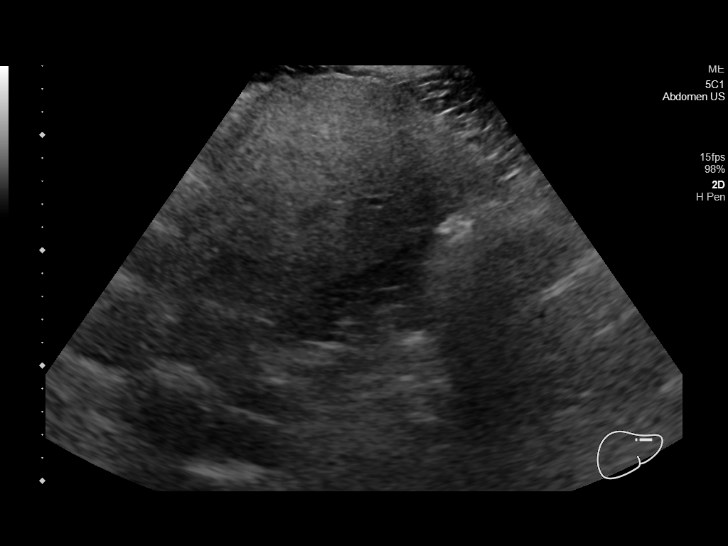
[im 31/47]
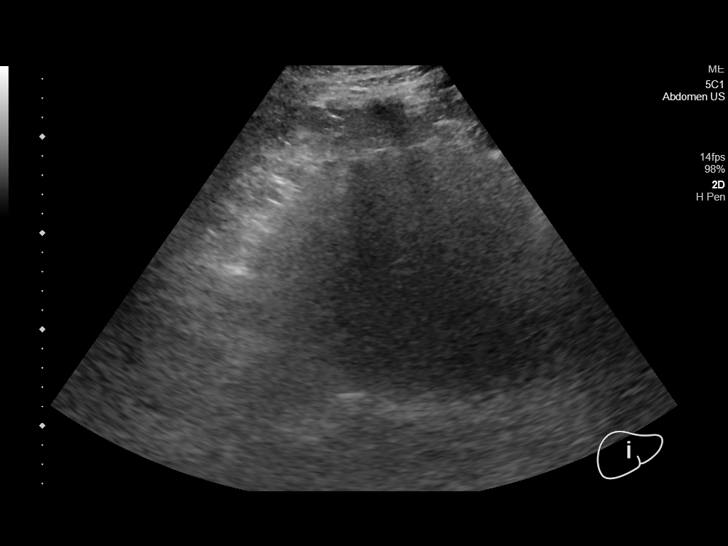
[im 35/47]
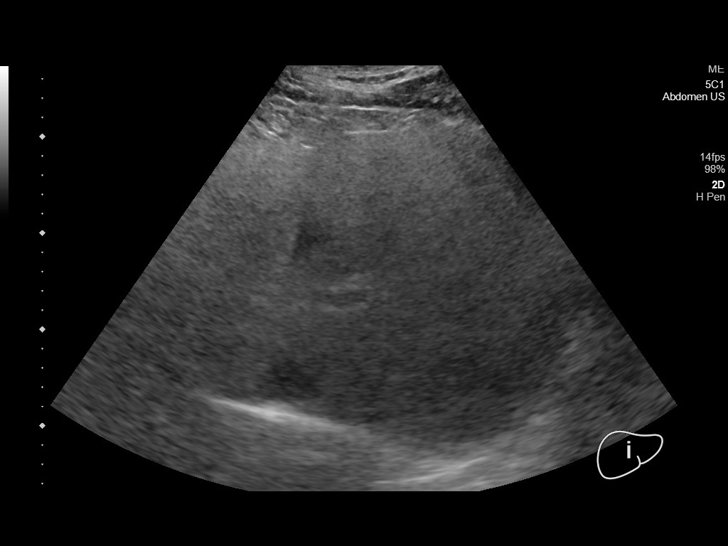
[im 39/47]
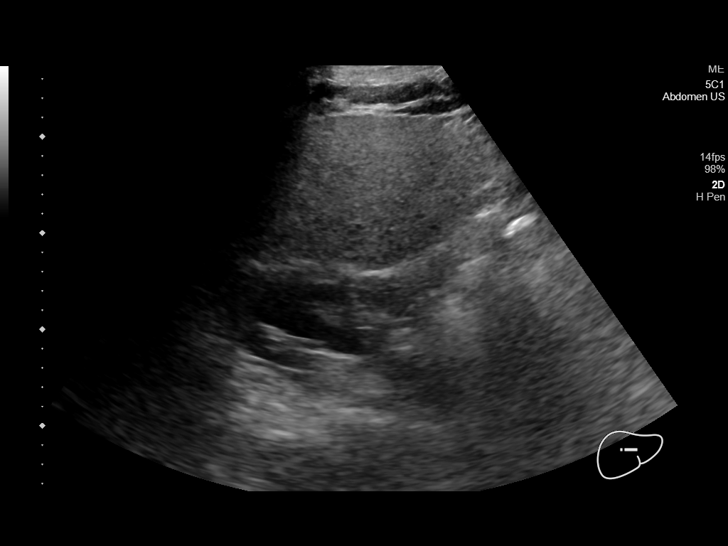
[im 43/47]
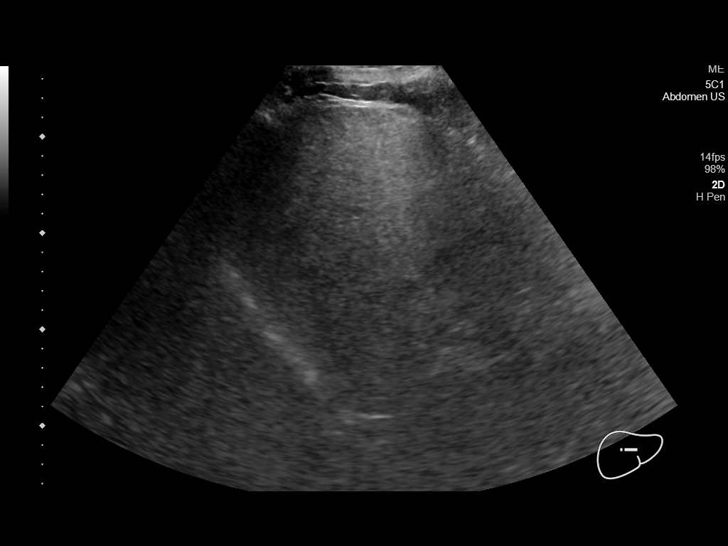
[im 47/47]
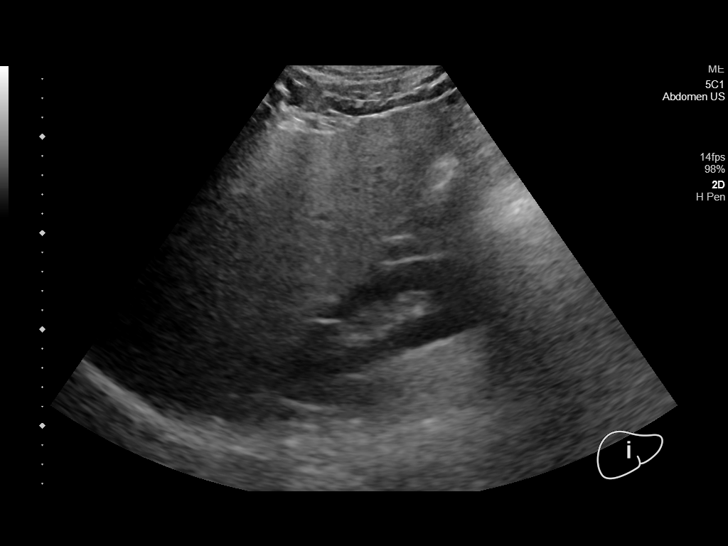

[13 of 25 positions shown; findings below may reference images not displayed]

FINDINGS: Gallbladder:

No visible shadowing gallstones. Much of the gallbladder wall is
normal thickness however along the anterior gallbladder body is a
region of focal mural thickening measuring up to 7 mm. No echogenic
foci, shadowing or, tail artifact is seen. No pericholecystic fluid.
Sonographic Murphy sign is reportedly negative.

Common bile duct:

Diameter: 4.9 mm, nondilated

Liver:

Nodular hepatic surface contour. Diffusely increased liver
echogenicity. No focal lesion. Portal vein is patent on color
Doppler imaging with normal direction of blood flow towards the
liver.

Other: None.
IMPRESSION: Nodular hepatic surface contour may be seen in the setting of
cirrhosis or intrinsic liver disease. Correlate with history and
liver serologies.

Diffusely increased liver echogenicity, often seen with fatty
infiltration.

Nonspecific focal gallbladder wall thickening along the mid body
without other clear sonographic features for distinction. Broad
differential with primary considerations including edematous
thickening in the setting of intrinsic liver disease or focus of
adenomyomatosis/cholesterolosis. Inflammation is considered less
likely in the absence of other convincing features of cholecystitis.
Consider repeat imaging in 3-6 months to assess for stability versus
palpation contrast-enhanced MRI.
